# Patient Record
Sex: Female | Born: 1975 | Hispanic: Yes | Marital: Married | Smoking: Never smoker
Health system: Southern US, Community
[De-identification: ages and names within clinical notes are randomized; demographics above are authoritative.]

---

## 2015-05-19 ENCOUNTER — Encounter (HOSPITAL_COMMUNITY): Payer: Self-pay

## 2015-05-19 DIAGNOSIS — Z3A01 Less than 8 weeks gestation of pregnancy: Secondary | ICD-10-CM | POA: Insufficient documentation

## 2015-05-19 DIAGNOSIS — O209 Hemorrhage in early pregnancy, unspecified: Secondary | ICD-10-CM | POA: Insufficient documentation

## 2015-05-19 NOTE — ED Notes (Signed)
Called x1

## 2015-05-19 NOTE — ED Notes (Signed)
Reports [redacted] weeks pregnant and having pain in left lower quadrant and having bleeding started today.

## 2015-05-20 ENCOUNTER — Emergency Department (HOSPITAL_COMMUNITY): Payer: Self-pay

## 2015-05-20 ENCOUNTER — Emergency Department (HOSPITAL_COMMUNITY)
Admission: EM | Admit: 2015-05-20 | Discharge: 2015-05-20 | Disposition: A | Discharge status: Home / Self Care | Payer: Self-pay | Attending: Emergency Medicine | Admitting: Emergency Medicine

## 2015-05-20 DIAGNOSIS — R1032 Left lower quadrant pain: Secondary | ICD-10-CM

## 2015-05-20 DIAGNOSIS — Z3490 Encounter for supervision of normal pregnancy, unspecified, unspecified trimester: Secondary | ICD-10-CM

## 2015-05-20 DIAGNOSIS — N939 Abnormal uterine and vaginal bleeding, unspecified: Secondary | ICD-10-CM

## 2015-05-20 DIAGNOSIS — O4690 Antepartum hemorrhage, unspecified, unspecified trimester: Secondary | ICD-10-CM

## 2015-05-20 LAB — ABO/RH: ABO/RH(D): O POS

## 2015-05-20 LAB — URINALYSIS, ROUTINE W REFLEX MICROSCOPIC
Bilirubin Urine: NEGATIVE
Glucose, UA: NEGATIVE mg/dL
KETONES UR: NEGATIVE mg/dL
Nitrite: NEGATIVE
PH: 5.5 (ref 5.0–8.0)
PROTEIN: NEGATIVE mg/dL
Specific Gravity, Urine: 1.025 (ref 1.005–1.030)
UROBILINOGEN UA: 1 mg/dL (ref 0.0–1.0)

## 2015-05-20 LAB — URINE MICROSCOPIC-ADD ON

## 2015-05-20 LAB — CBC
HCT: 34.2 % — ABNORMAL LOW (ref 36.0–46.0)
Hemoglobin: 10.9 g/dL — ABNORMAL LOW (ref 12.0–15.0)
MCH: 22.6 pg — ABNORMAL LOW (ref 26.0–34.0)
MCHC: 31.9 g/dL (ref 30.0–36.0)
MCV: 70.8 fL — ABNORMAL LOW (ref 78.0–100.0)
Platelets: 283 10*3/uL (ref 150–400)
RBC: 4.83 MIL/uL (ref 3.87–5.11)
RDW: 19.9 % — AB (ref 11.5–15.5)
WBC: 6.9 10*3/uL (ref 4.0–10.5)

## 2015-05-20 LAB — HCG, QUANTITATIVE, PREGNANCY: HCG, BETA CHAIN, QUANT, S: 30138 m[IU]/mL — AB (ref <5)

## 2015-05-20 NOTE — ED Notes (Signed)
Pt at [facility]

## 2015-05-20 NOTE — ED Provider Notes (Signed)
CSN: [id number]     Arrival date & time 05/19/15  2308 History  This chart was scribed for Azalia BilisKevin Rajah Tagliaferro, MD by Octavia HeirArianna Nassar, ED Scribe. This patient was seen in room B14C/B14C and the patient's care was started at 2:20 AM.     Chief Complaint  Patient presents with  . Possible Pregnancy      The history is provided by the patient. No language interpreter was used.   HPI Comments: Nicole Farrell is a 39 y.o. female G8 P6 A1 who presents to the Emergency Department complaining of vaginal bleeding and mild left lower pelvic pain.  She had a positive pregnancy test at home.  She's not had OB follow-up to this point.  She had a miscarriage in March.  She's had spotting today.  She had no other complications during her other pregnancies.  She reports mild left-sided discomfort which is colicky in nature.  She denies left flank pain.  No dysuria or urinary frequency.  Denies nausea vomiting.  No fevers and chills.  History reviewed. No pertinent past medical history. History reviewed. No pertinent past surgical history. History reviewed. No pertinent family history. History  Substance Use Topics  . Smoking status: Never Smoker   . Smokeless tobacco: Not on file  . Alcohol Use: No   OB History    Gravida Para Term Preterm AB TAB SAB Ectopic Multiple Living   1              Review of Systems  All other systems reviewed and are negative.   A complete 10 system review of systems was obtained and all systems are negative except as noted in the HPI and PMH.    Allergies  Review of patient's allergies indicates not on file.  Home Medications   Prior to Admission medications   Not on File   Triage vitals: BP 104/66 mmHg  Pulse 77  Temp(Src) 98.5 F (36.9 C) (Oral)  Resp 18  Ht 5\' 1"  (1.549 m)  Wt 136 lb 6 oz (61.859 kg)  BMI 25.78 kg/m2  SpO2 100% Physical Exam  Constitutional: She is oriented to person, place, and time. She appears well-developed and well-nourished. No  distress.  HENT:  Head: Normocephalic and atraumatic.  Eyes: EOM are normal.  Neck: Normal range of motion.  Cardiovascular: Normal rate, regular rhythm and normal heart sounds.   Pulmonary/Chest: Effort normal and breath sounds normal.  Abdominal: Soft. She exhibits no distension. There is no tenderness.  Musculoskeletal: Normal range of motion.  Neurological: She is alert and oriented to person, place, and time.  Skin: Skin is warm and dry.  Psychiatric: She has a normal mood and affect. Judgment normal.  Nursing note and vitals reviewed.   ED Course  Procedures  DIAGNOSTIC STUDIES:   COORDINATION OF CARE:   Labs Review Labs Reviewed  HCG, QUANTITATIVE, PREGNANCY - Abnormal; Notable for the following:    hCG, Beta Chain, [place] LongestQuant, S [id number] (*)    All other components within normal limits  CBC - Abnormal; Notable for the following:    Hemoglobin 10.9 (*)    HCT 34.2 (*)    MCV 70.8 (*)    MCH 22.6 (*)    RDW 19.9 (*)    All other components within normal limits  URINALYSIS, ROUTINE W REFLEX MICROSCOPIC (NOT AT [facility]) - Abnormal; Notable for the following:    Hgb urine dipstick LARGE (*)    Leukocytes, UA TRACE (*)    All other components  within normal limits  URINE MICROSCOPIC-ADD ON  ABO/RH    Imaging Review US Ob Comp Less 14 Wks  05/20/2015   CLINICAL DATA:  Vaginal bleeding. Left pelvic pain. Estimated gestational age by LMP is 7 weeks 0 days. Quantitative beta HCG is 30,138.  EXAM: OBSTETRIC <14 WK [place] AND TRANSVAGINAL OB US  TECHNIQUE: Both transabdominal and transvaginal ultrasound examinations were performed for complete evaluation of the gestation as well as the maternal uterus, adnexal regions, and pelvic cul-de-sac. Transvaginal technique was performed to assess early pregnancy.  COMPARISON:  None.  FINDINGS: Intrauterine gestational sac: A single intrauterine gestational sac is present.  Yolk sac:  Identified.  Embryo:  Identified.  Cardiac Activity: Observed.   Heart Rate: 113  bpm  CRL:  5.1  mm   6 w   2 d                  [place] EDC: 01/11/2016  Maternal uterus/adnexae: The uterus is anteverted. There is a small subchorionic hemorrhage anteriorly. No focal myometrial mass lesions. Both ovaries are identified and appear normal. No abnormal adnexal masses. No free pelvic fluid.  IMPRESSION: Single intrauterine pregnancy. Estimated gestational age by crown-rump length is 6 weeks 2 days. Small subchorionic hemorrhage is identified.   Electronically Signed   By: Burman Nieves M.D.   On: 05/20/2015 04:03   US Ob Transvaginal  05/20/2015   CLINICAL DATA:  Vaginal bleeding. Left pelvic pain. Estimated gestational age by LMP is 7 weeks 0 days. Quantitative beta HCG is 30,138.  EXAM: OBSTETRIC <14 WK [place] AND TRANSVAGINAL OB US  TECHNIQUE: Both transabdominal and transvaginal ultrasound examinations were performed for complete evaluation of the gestation as well as the maternal uterus, adnexal regions, and pelvic cul-de-sac. Transvaginal technique was performed to assess early pregnancy.  COMPARISON:  None.  FINDINGS: Intrauterine gestational sac: A single intrauterine gestational sac is present.  Yolk sac:  Identified.  Embryo:  Identified.  Cardiac Activity: Observed.  Heart Rate: 113  bpm  CRL:  5.1  mm   6 w   2 d                  [place] EDC: 01/11/2016  Maternal uterus/adnexae: The uterus is anteverted. There is a small subchorionic hemorrhage anteriorly. No focal myometrial mass lesions. Both ovaries are identified and appear normal. No abnormal adnexal masses. No free pelvic fluid.  IMPRESSION: Single intrauterine pregnancy. Estimated gestational age by crown-rump length is 6 weeks 2 days. Small subchorionic hemorrhage is identified.   Electronically Signed   By: Burman Nieves M.D.   On: 05/20/2015 04:03  I personally reviewed the imaging tests through PACS system I reviewed available ER/hospitalization records through the EMR    EKG Interpretation None      MDM    Final diagnoses:  None    Abdominal exam is benign this time.  Intrauterine pregnancy noted on ultrasound.  Outpatient OB follow-up  I personally performed the services described in this documentation, which was scribed in my presence. The recorded information has been reviewed and is accurate.     Azalia Bilis, MD 05/20/15 [phone]

## 2015-05-20 NOTE — ED Notes (Signed)
Used the translator phone to assess pt.  This is her 8th pregnancy, 6 were live births, one was a miscarriage that occurred in March.  Like now she was bleeding and after coming to the hospital a second time was told she had lost the baby.  She has been spotting since 7pm Sunday night, enough to spot on her underwear and when she wipes w/ toilet paper.  She reports her other pregnancies as "normal" w/ no complications.

## 2015-05-20 NOTE — Discharge Instructions (Signed)
Hemorragia vaginal durante el embarazo (primer trimestre) °(Vaginal Bleeding During Pregnancy, First Trimester) °Durante los primeros meses del embarazo es relativamente frecuente que se presente una pequeña hemorragia (manchas). Esta situación generalmente mejora por sí misma. Estas hemorragias o manchas tienen diversas causas al inicio del embarazo. Algunas manchas pueden estar relacionadas al embarazo y otras no. En la mayoría de los casos, la hemorragia es normal y no es un problema. Sin embargo, la hemorragia también puede ser un signo de algo grave. Debe informar a su médico de inmediato si tiene alguna hemorragia vaginal. °Algunas causas posibles de hemorragia vaginal durante el primer trimestre incluyen: °· Infección o inflamación del cuello del útero. °· Crecimientos anormales (pólipos) en el cuello del útero. °· Aborto espontáneo o amenaza de aborto espontáneo. °· Tejido del embarazo se ha desarrollado fuera del útero y en una trompa de falopio (embarazo ectópico). °· Se han desarrollado pequeños quistes en el útero en lugar de tejido de embarazo (embarazo molar). °INSTRUCCIONES PARA EL CUIDADO EN EL HOGAR  °Controle su afección para ver si hay cambios. Las siguientes indicaciones ayudarán a aliviar cualquier molestia que pueda sentir: °· Siga las indicaciones del médico para restringir su actividad. Si el médico le indica descanso en la cama, debe quedarse en la cama y levantarse solo para ir al baño. No obstante, el médico puede permitirle que continué con tareas livianas. °· Si es necesario, organícese para que alguien le ayude con las actividades y responsabilidades cotidianas mientras está en cama. °· Lleve un registro de la cantidad y la saturación de las toallas higiénicas que utiliza cada día. Anote este dato. °· No use tampones.No se haga duchas vaginales. °· No tenga relaciones sexuales u orgasmos hasta que el médico la autorice. °· Si elimina tejido por la vagina, guárdelo para mostrárselo al  médico. °· Tome solo medicamentos de venta libre o recetados, según las indicaciones del médico. °· No tome aspirina, ya que puede causar hemorragias. °· Cumpla con todas las visitas de control, según le indique su médico. °SOLICITE ATENCIÓN MÉDICA SI: °· Tiene un sangrado vaginal en cualquier momento del embarazo. °· Tiene calambres o dolores de parto. °· Tiene fiebre que los medicamentos no pueden controlar. °SOLICITE ATENCIÓN MÉDICA DE INMEDIATO SI:  °· Siente calambres intensos en la espalda o en el vientre (abdomen). °· Elimina coágulos grandes o tejido por la vagina. °· La hemorragia aumenta. °· Si se siente mareada, débil o se desmaya. °· Tiene escalofríos. °· Tiene una pérdida importante o sale líquido a borbotones por la vagina. °· Se desmaya mientras defeca. °ASEGÚRESE DE QUE: °· Comprende estas instrucciones. °· Controlará su afección. °· Recibirá ayuda de inmediato si no mejora o si empeora. °Document Released: 08/05/2005 Document Revised: 10/31/2013 °[employer]® Patient Information ©2015 [employer]. This information is not intended to replace advice given to you by your health care provider. Make sure you discuss any questions you have with your health care provider. ° °

## 2016-09-09 IMAGING — US US OB COMP LESS 14 WK
1 series · 14 of 28 positions shown · non-contrast
Comparison: None.

CLINICAL DATA: Vaginal bleeding. Left pelvic pain. Estimated
gestational age by LMP is 7 weeks 0 days. Quantitative beta HCG is
[DATE].

EXAM:
OBSTETRIC <14 WK US AND TRANSVAGINAL OB US
TECHNIQUE: Both transabdominal and transvaginal ultrasound examinations were
performed for complete evaluation of the gestation as well as the
maternal uterus, adnexal regions, and pelvic cul-de-sac.
Transvaginal technique was performed to assess early pregnancy.

[Series 1: us ob comp less 14 wk · 0.18mm/px · 14 of 32 slices shown]
[im 2/32]
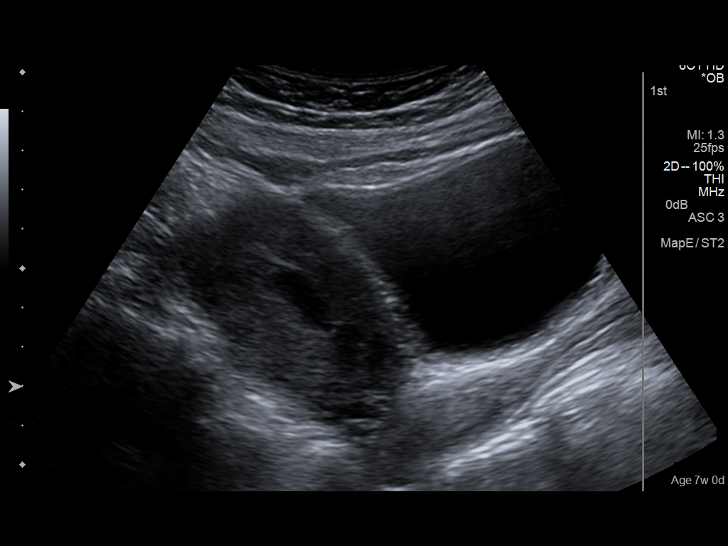
[im 4/32]
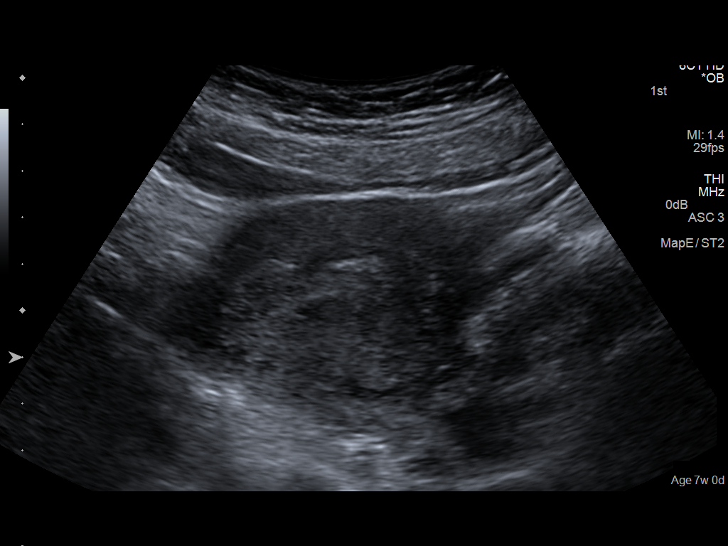
[im 6/32]
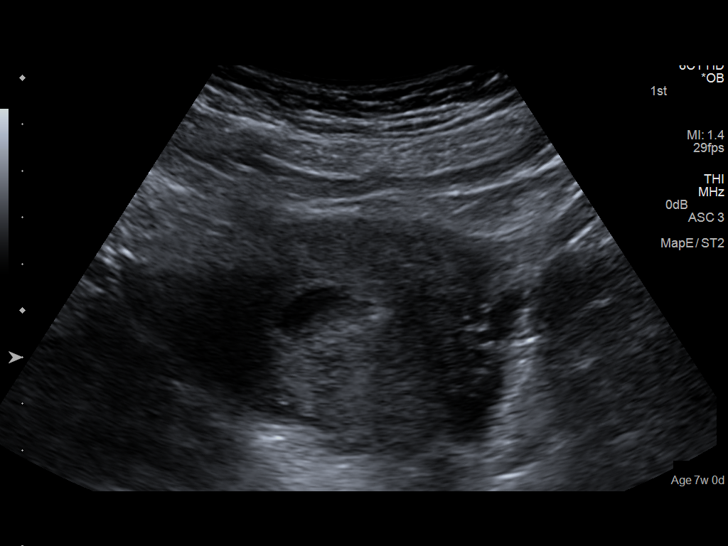
[im 9/32]
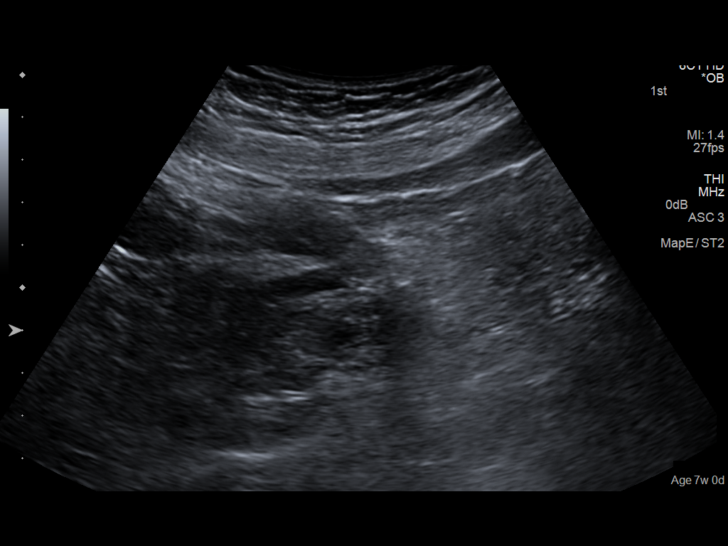
[im 11/32]
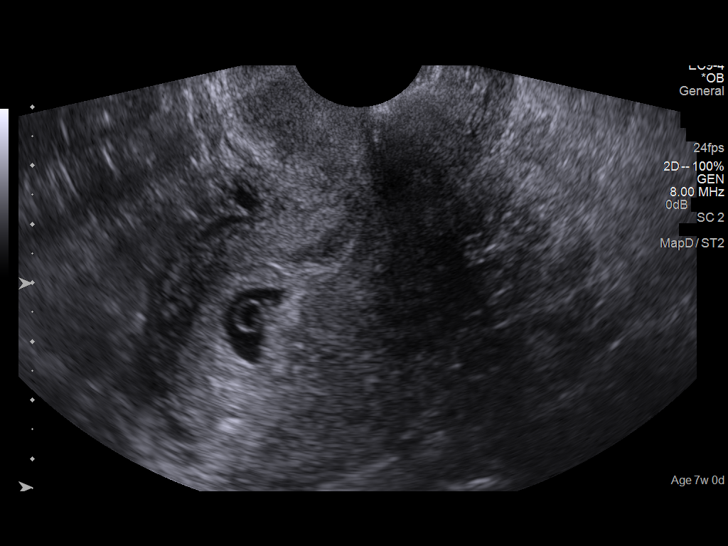
[im 13/32]
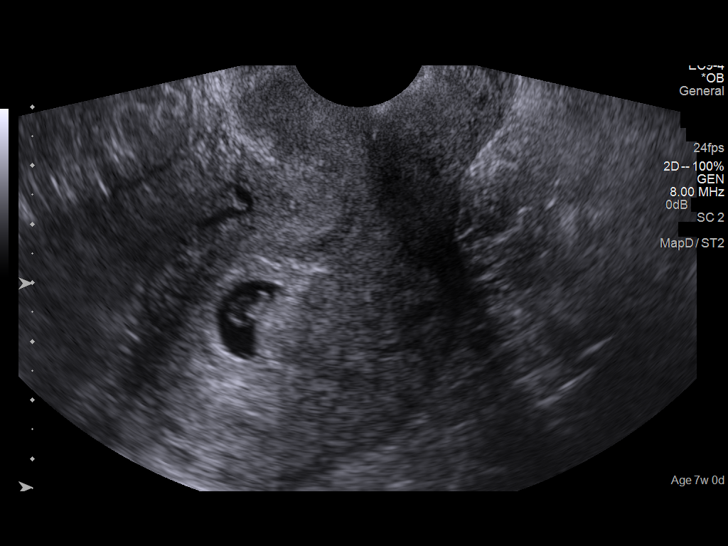
[im 15/32]
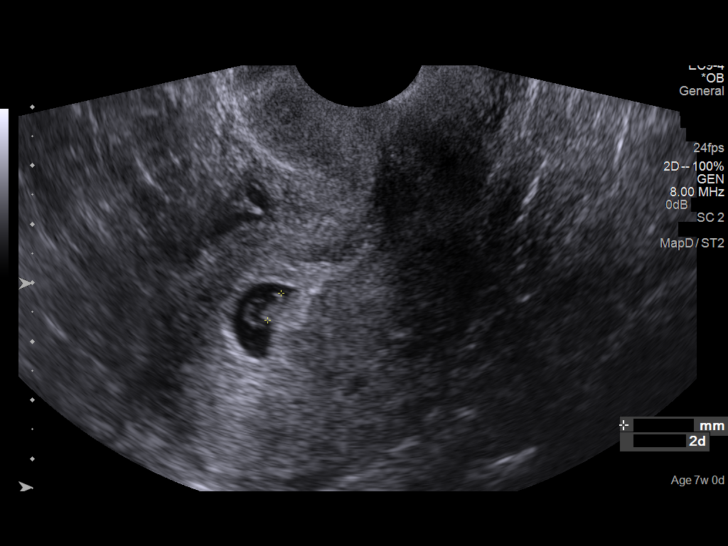
[im 18/32]
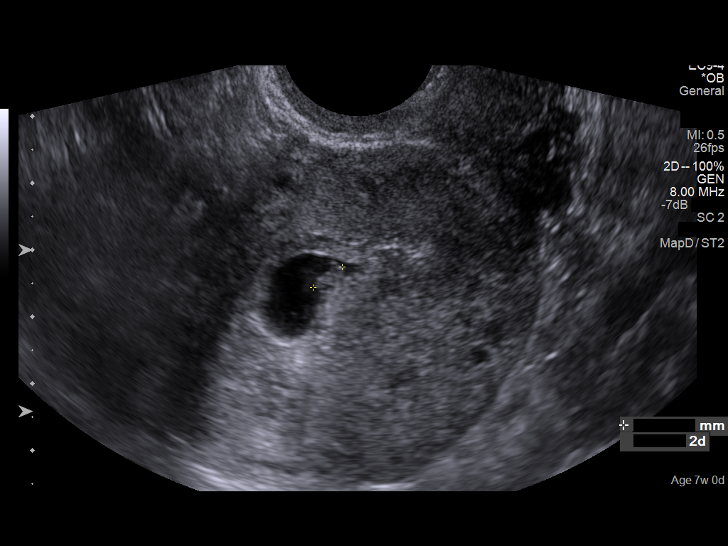
[im 20/32]
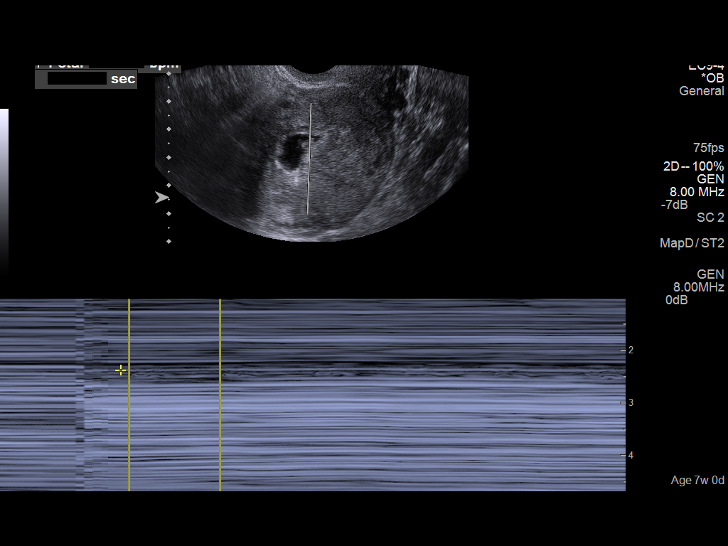
[im 22/32]
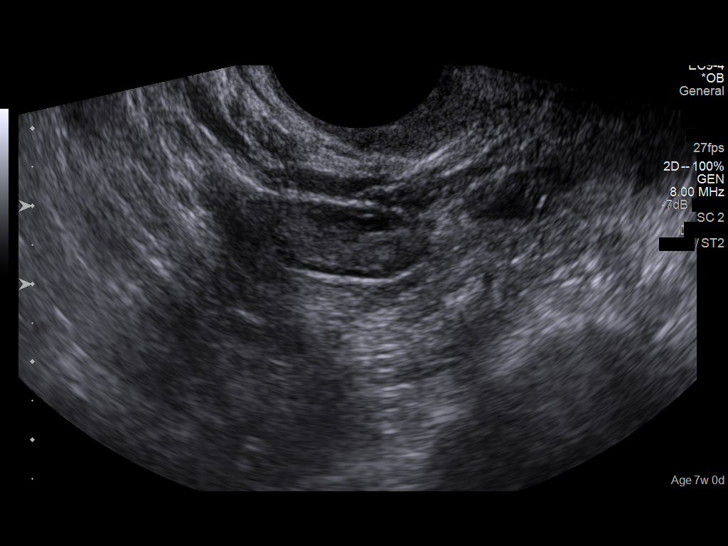
[im 25/32]
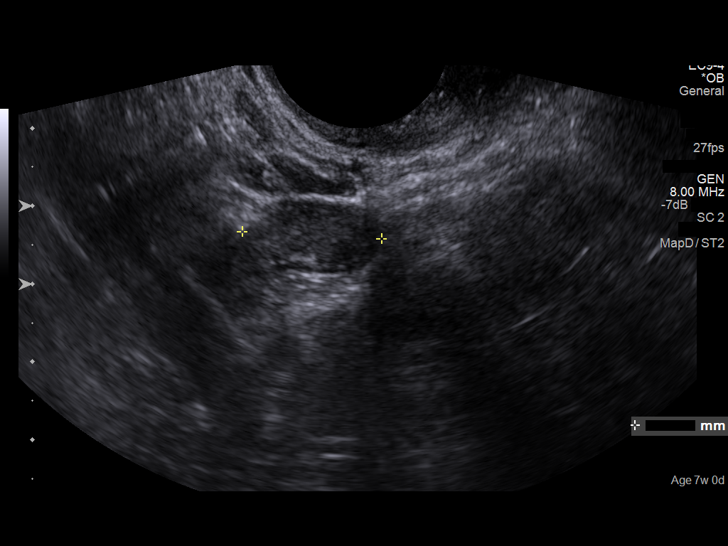
[im 27/32]
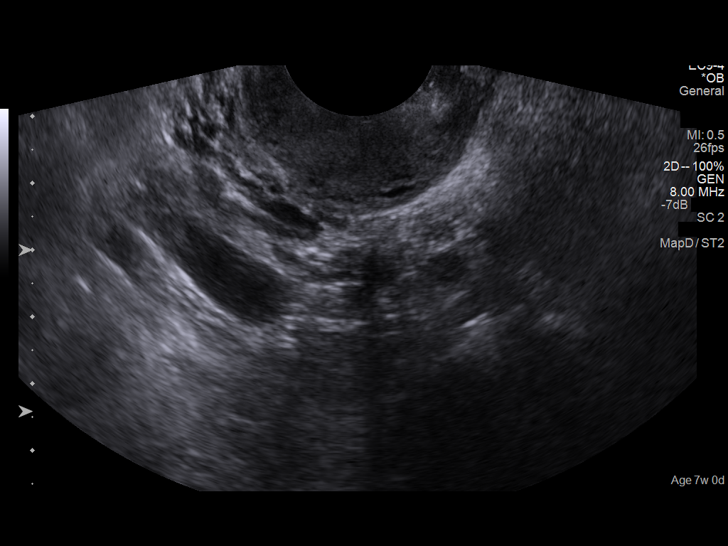
[im 29/32]
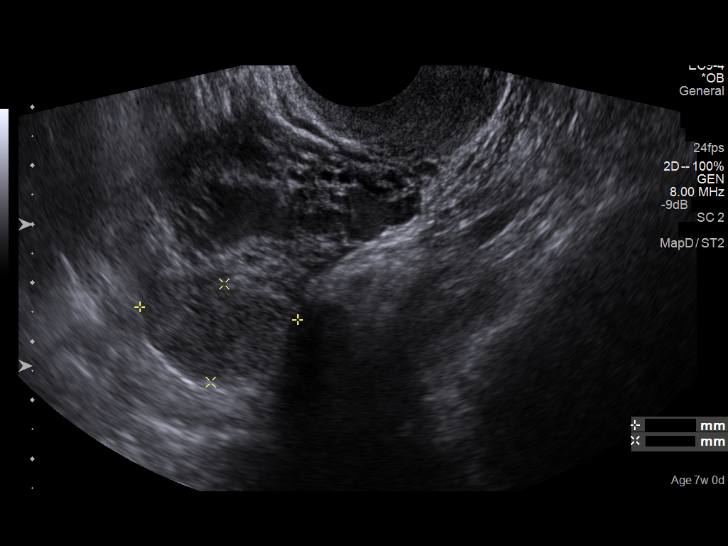
[im 32/32]
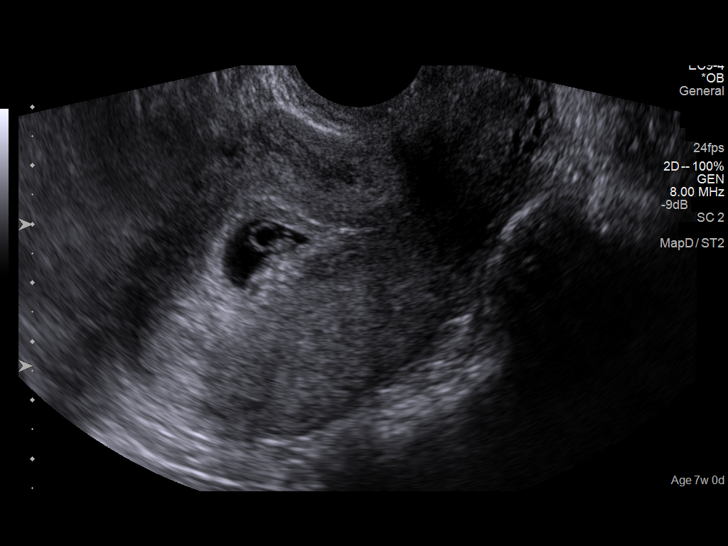

[14 of 28 positions shown; findings below may reference images not displayed]

FINDINGS: Intrauterine gestational sac: A single intrauterine gestational sac
is present.

Yolk sac:  Identified.

Embryo:  Identified.

Cardiac Activity: Observed.

Heart Rate: 113  bpm

CRL:  5.1  mm   6 w   2 d                  US EDC: 01/11/2016

Maternal uterus/adnexae: The uterus is anteverted. There is a small
subchorionic hemorrhage anteriorly. No focal myometrial mass
lesions. Both ovaries are identified and appear normal. No abnormal
adnexal masses. No free pelvic fluid.
IMPRESSION: Single intrauterine pregnancy. Estimated gestational age by
crown-rump length is 6 weeks 2 days. Small subchorionic hemorrhage
is identified.
